# Patient Record
Sex: Male | Born: 1950 | Race: Black or African American | Hispanic: No | Marital: Married | Smoking: Former smoker
Health system: Southern US, Community
[De-identification: ages and names within clinical notes are randomized; demographics above are authoritative.]

## PROBLEM LIST (undated history)

## (undated) DIAGNOSIS — N200 Calculus of kidney: Secondary | ICD-10-CM

## (undated) DIAGNOSIS — Z8719 Personal history of other diseases of the digestive system: Secondary | ICD-10-CM

## (undated) DIAGNOSIS — I509 Heart failure, unspecified: Secondary | ICD-10-CM

## (undated) DIAGNOSIS — I517 Cardiomegaly: Secondary | ICD-10-CM

## (undated) DIAGNOSIS — R7303 Prediabetes: Secondary | ICD-10-CM

## (undated) DIAGNOSIS — I10 Essential (primary) hypertension: Secondary | ICD-10-CM

## (undated) DIAGNOSIS — N201 Calculus of ureter: Secondary | ICD-10-CM

## (undated) DIAGNOSIS — C679 Malignant neoplasm of bladder, unspecified: Secondary | ICD-10-CM

## (undated) DIAGNOSIS — C61 Malignant neoplasm of prostate: Secondary | ICD-10-CM

## (undated) DIAGNOSIS — I4892 Unspecified atrial flutter: Secondary | ICD-10-CM

## (undated) HISTORY — PX: HERNIA REPAIR: SHX51

## (undated) HISTORY — PX: COLONOSCOPY: SHX174

## (undated) HISTORY — PX: TRANSURETHRAL RESECTION OF BLADDER TUMOR WITH GYRUS (TURBT-GYRUS): SHX6458

## (undated) HISTORY — PX: LITHOTRIPSY: SUR834

---

## 2007-07-12 DIAGNOSIS — I10 Essential (primary) hypertension: Secondary | ICD-10-CM | POA: Insufficient documentation

## 2012-03-22 ENCOUNTER — Other Ambulatory Visit (HOSPITAL_COMMUNITY): Payer: Self-pay | Admitting: Urology

## 2012-03-22 ENCOUNTER — Ambulatory Visit (HOSPITAL_COMMUNITY)
Admission: RE | Admit: 2012-03-22 | Discharge: 2012-03-22 | Disposition: A | Discharge status: Home / Self Care | Payer: BC Managed Care – PPO | Source: Ambulatory Visit | Attending: Urology | Admitting: Urology

## 2012-03-22 DIAGNOSIS — R109 Unspecified abdominal pain: Secondary | ICD-10-CM | POA: Insufficient documentation

## 2012-03-22 DIAGNOSIS — N201 Calculus of ureter: Secondary | ICD-10-CM

## 2012-05-05 ENCOUNTER — Other Ambulatory Visit (HOSPITAL_COMMUNITY): Payer: Self-pay | Admitting: Urology

## 2012-05-05 ENCOUNTER — Ambulatory Visit (HOSPITAL_COMMUNITY)
Admission: RE | Admit: 2012-05-05 | Discharge: 2012-05-05 | Disposition: A | Discharge status: Home / Self Care | Payer: BC Managed Care – PPO | Source: Ambulatory Visit | Attending: Urology | Admitting: Urology

## 2012-05-05 DIAGNOSIS — Z87442 Personal history of urinary calculi: Secondary | ICD-10-CM | POA: Insufficient documentation

## 2012-05-05 DIAGNOSIS — N200 Calculus of kidney: Secondary | ICD-10-CM

## 2012-05-05 DIAGNOSIS — N201 Calculus of ureter: Secondary | ICD-10-CM

## 2012-05-05 DIAGNOSIS — R109 Unspecified abdominal pain: Secondary | ICD-10-CM | POA: Insufficient documentation

## 2012-05-05 HISTORY — DX: Calculus of kidney: N20.0

## 2012-05-05 HISTORY — DX: Calculus of ureter: N20.1

## 2018-07-08 HISTORY — PX: PROSTATE BIOPSY: SHX241

## 2018-09-29 ENCOUNTER — Other Ambulatory Visit: Payer: Self-pay

## 2018-09-29 ENCOUNTER — Ambulatory Visit
Admission: RE | Admit: 2018-09-29 | Discharge: 2018-09-29 | Disposition: A | Discharge status: Home / Self Care | Payer: BLUE CROSS/BLUE SHIELD | Source: Ambulatory Visit | Attending: Radiation Oncology | Admitting: Radiation Oncology

## 2018-09-29 ENCOUNTER — Encounter: Payer: Self-pay | Admitting: Radiation Oncology

## 2018-09-29 VITALS — BP 135/88 | HR 90 | Temp 98.0°F | Resp 20 | Ht 72.0 in | Wt 189.0 lb

## 2018-09-29 DIAGNOSIS — Z87891 Personal history of nicotine dependence: Secondary | ICD-10-CM | POA: Diagnosis not present

## 2018-09-29 DIAGNOSIS — Z8551 Personal history of malignant neoplasm of bladder: Secondary | ICD-10-CM | POA: Insufficient documentation

## 2018-09-29 DIAGNOSIS — C61 Malignant neoplasm of prostate: Secondary | ICD-10-CM

## 2018-09-29 DIAGNOSIS — Z8546 Personal history of malignant neoplasm of prostate: Secondary | ICD-10-CM | POA: Insufficient documentation

## 2018-09-29 DIAGNOSIS — Z79899 Other long term (current) drug therapy: Secondary | ICD-10-CM | POA: Diagnosis not present

## 2018-09-29 DIAGNOSIS — Z7901 Long term (current) use of anticoagulants: Secondary | ICD-10-CM | POA: Diagnosis not present

## 2018-09-29 DIAGNOSIS — Z7982 Long term (current) use of aspirin: Secondary | ICD-10-CM | POA: Diagnosis not present

## 2018-09-29 HISTORY — DX: Malignant neoplasm of bladder, unspecified: C67.9

## 2018-09-29 HISTORY — DX: Malignant neoplasm of prostate: C61

## 2018-09-29 NOTE — Progress Notes (Signed)
GU Location of Tumor / Histology: prostatic adenocarcinoma  If Prostate Cancer, Gleason Score is (3 + 4) and PSA is (7). Prostate volume: 27.48 cc.   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 In July, Dr. Lerry Liner referred the patient to Dr. Gloriann Loan for further evaluation of an elevated PSA.   Biopsies of prostate (if applicable) revealed:    Past/Anticipated interventions by urology, if any: prostate biopsy, CT (negative), bone scan (negative), referral to radiation oncology  Past/Anticipated interventions by medical oncology, if any: no  Weight changes, if any: no  Bowel/Bladder complaints, if any: Denies dysuria, urinary leakage or incontinence. Reports he recently had to have a urinary catheter placed due to retention. Patient questions if his hematuria is related to this or kidney stones.    Nausea/Vomiting, if any: no  Pain issues, if any:  no  SAFETY ISSUES:  Prior radiation? no  Pacemaker/ICD? no  Possible current pregnancy? no  Is the patient on methotrexate? no  Current Complaints / other details:  67 year old male. Married with two son. Mother and father both with hx of cancer. Patient interest in brachytherapy.

## 2018-09-29 NOTE — Progress Notes (Signed)
Radiation Oncology         [phone]) [phone] ________________________________  Initial outpatient Consultation  Name: Jason Sanders MRN: [id number]  Date: 09/29/2018  DOB: [birth year]  CC:No primary care provider on file.  Jason Mallow, MD   REFERRING PHYSICIAN: Lucas Mallow, MD  DIAGNOSIS: 67 y.o. gentleman with Stage T1c adenocarcinoma of the prostate with Gleason score of 3+4, and PSA of 7.    ICD-10-CM   1. Malignant neoplasm of prostate (Jason Sanders) C61     HISTORY OF PRESENT ILLNESS: Jason Sanders is a 67 y.o. male with a diagnosis of prostate cancer. He was noted to have an elevated PSA of 7.  Accordingly, he was referred for evaluation in urology by Dr. Lerry Sanders in [place], [place] in August 2019.  The patient proceeded to transrectal ultrasound with 12 biopsies of the prostate on 07/08/18.  The prostate volume measured 27.48 cc.  Out of 12 core biopsies, 3 were positive.  The maximum Gleason score was 3+4, and this was seen in right lateral apex and mid.  CT abdomen/pelvis and bone scan performed on 07/27/18 were both negative for metastatic disease.  He was referred for evaluation in urology in [facility] to Dr. Gloriann Sanders.  The patient has kindly been referred today for discussion of potential radiation treatment options.   PREVIOUS RADIATION THERAPY: No  PAST MEDICAL HISTORY:  Past Medical History:  Diagnosis Date  . Bladder cancer ([place])   . Prostate cancer ([place])       PAST SURGICAL HISTORY: Past Surgical History:  Procedure Laterality Date  . TRANSURETHRAL RESECTION OF BLADDER TUMOR WITH GYRUS (TURBT-GYRUS)      FAMILY HISTORY:  Family History  Problem Relation Age of Onset  . Cancer Mother   . Cancer Father     SOCIAL HISTORY:  Social History   Socioeconomic History  . Marital status: Married    Spouse name: Not on file  . Number of children: Not on file  . Years of education: Not on file  . Highest education level: Not on file  Occupational History  . Not on  file  Social Needs  . Financial resource strain: Not on file  . Food insecurity:    Worry: Not on file    Inability: Not on file  . Transportation needs:    Medical: Not on file    Non-medical: Not on file  Tobacco Use  . Smoking status: Former Smoker    Packs/day: 1.00    Years: 15.00    Pack years: 15.00    Types: Cigarettes    Last attempt to quit: 11/18/2003    Years since quitting: 14.8  . Smokeless tobacco: Never Used  Substance and Sexual Activity  . Alcohol use: Never    Frequency: Never  . Drug use: Never  . Sexual activity: Not Currently  Lifestyle  . Physical activity:    Days per week: Not on file    Minutes per session: Not on file  . Stress: Not on file  Relationships  . Social connections:    Talks on phone: Not on file    Gets together: Not on file    Attends religious service: Not on file    Active member of club or organization: Not on file    Attends meetings of clubs or organizations: Not on file    Relationship status: Not on file  . Intimate partner violence:    Fear of current or ex partner: Not on file  Emotionally abused: Not on file    Physically abused: Not on file    Forced sexual activity: Not on file  Other Topics Concern  . Not on file  Social History Narrative  . Not on file    ALLERGIES: Penicillins  MEDICATIONS:  Current Outpatient Medications  Medication Sig Dispense Refill  . ASPIRIN 81 PO TAKE 1 TABLET BY MOUTH EVERY DAY  11  . carvedilol (COREG) 12.5 MG tablet     . diltiazem (CARDIZEM) 120 MG tablet     . ELIQUIS 5 MG TABS tablet Take 5 mg by mouth 2 (two) times daily.  1  . ENTRESTO 97-103 MG Take 1 tablet by mouth 2 (two) times daily.  12  . furosemide (LASIX) 20 MG tablet Take 20 mg by mouth daily.  1  . rosuvastatin (CRESTOR) 40 MG tablet TAKE 1 TABLET BY MOUTH EVERYDAY AT BEDTIME  1  . tamsulosin (FLOMAX) 0.4 MG CAPS capsule TAKE 2 CAPSULES BY MOUTH DAILY AFTER BREAKFAST  3   No current facility-administered  medications for this encounter.     REVIEW OF SYSTEMS:  On review of systems, the patient reports that he is doing well overall. He denies any chest pain, shortness of breath, cough, fevers, chills, night sweats, unintended weight changes. He denies any bowel disturbances, and denies abdominal pain, nausea or vomiting. He denies any new musculoskeletal or joint aches or pains. He reports he recently had a urinary catheter placed due to retention. He reports hematuria, which he questions if it's related to the catheter or to kidney stones. His IPSS was 5, indicating mild urinary symptoms. His SHIM indicated he does have erectile dysfunction. A complete review of systems is obtained and is otherwise negative.   PHYSICAL EXAM:  Wt Readings from Last 3 Encounters:  09/29/18 189 lb (85.7 kg)   Temp Readings from Last 3 Encounters:  09/29/18 98 F (36.7 C) (Oral)   BP Readings from Last 3 Encounters:  09/29/18 135/88   Pulse Readings from Last 3 Encounters:  09/29/18 90   Pain Assessment Pain Score: 0-No pain/10  In general this is a well appearing African American gentleman in no acute distress. He is alert and oriented x4 and appropriate throughout the examination. HEENT reveals that the patient is normocephalic, atraumatic. EOMs are intact. PERRLA. Skin is intact without any evidence of gross lesions. Cardiovascular exam reveals a regular rate and rhythm, no clicks rubs or murmurs are auscultated. Chest is clear to auscultation bilaterally. Lymphatic assessment is performed and does not reveal any adenopathy in the cervical, supraclavicular, axillary, or inguinal chains. Abdomen has active bowel sounds in all quadrants and is intact. The abdomen is soft, non tender, non distended. Lower extremities are negative for pretibial pitting edema, deep calf tenderness, cyanosis or clubbing.   KPS = 100  100 - Normal; no complaints; no evidence of disease. 90   - Able to carry on normal activity;  minor signs or symptoms of disease. 80   - Normal activity with effort; some signs or symptoms of disease. 17   - Cares for self; unable to carry on normal activity or to do active work. 60   - Requires occasional assistance, but is able to care for most of his personal needs. 50   - Requires considerable assistance and frequent medical care. 33   - Disabled; requires special care and assistance. 21   - Severely disabled; hospital admission is indicated although death not imminent. 20   -  Very sick; hospital admission necessary; active supportive treatment necessary. 10   - Moribund; fatal processes progressing rapidly. 0     - Dead  Karnofsky DA, Abelmann WH, Craver LS and Burchenal JH [phone]) The use of the nitrogen mustards in the palliative treatment of carcinoma: with particular reference to bronchogenic carcinoma Cancer 1 634-56  LABORATORY DATA:  No results found for: WBC, HGB, HCT, MCV, PLT No results found for: NA, K, CL, CO2 No results found for: ALT, AST, GGT, ALKPHOS, BILITOT   RADIOGRAPHY: No results found.    IMPRESSION/PLAN: 1. 67 y.o. gentleman with Stage T1c adenocarcinoma of the prostate with Gleason Score of 3+4, and PSA of 7. We discussed the patient's workup and outlines the nature of prostate cancer in this setting. The patient's T stage, Gleason's score, and PSA put him into the favorable intermediate risk group. Accordingly, he is eligible for a variety of potential treatment options including brachytherapy versus 5.5 weeks of external radiation. We discussed the available radiation techniques, and focused on the details and logistics and delivery. We discussed and outlined the risks, benefits, short and long-term effects associated with radiotherapy and compared and contrasted these with prostatectomy. We discussed the role of SpaceOAR in reducing the rectal toxicity associated with radiotherapy.   At the end of the conversation the patient is interested in moving  forward with brachytherapy and use of SpaceOAR to reduce rectal toxicity from radiotherapy.  We will share our discussion with Dr. Gloriann Sanders and move forward with scheduling his CT [facility] planning appointment in the near future.  The patient met briefly with Jason Sanders in our office who will be working closely with him to coordinate OR scheduling and pre and post procedure appointments.    He will have a prostate MRI following his post-seed CT SIM to confirm appropriate distribution of the Jason Sanders.  We spent time face to face with the patient and more than 50% of that time was spent in counseling and/or coordination of care.    Jason Johns, PA-C    Jason Pita, MD  [facility] Direct Dial: [phone]  Fax: [phone] [email or web address]  Skype  LinkedIn   This document serves as a record of services personally performed by Jason Pita, MD and Jason Caldron, PA-C. It was created on their behalf by Jason Sanders, a trained medical scribe. The creation of this record is based on the scribe's personal observations and the provider's statements to them. This document has been checked and approved by the attending provider.

## 2018-09-29 NOTE — Progress Notes (Signed)
See progress note under physician encounter. 

## 2018-10-01 ENCOUNTER — Other Ambulatory Visit: Payer: Self-pay | Admitting: Urology

## 2018-10-01 ENCOUNTER — Telehealth: Payer: Self-pay | Admitting: *Deleted

## 2018-10-01 NOTE — Telephone Encounter (Signed)
CALLED PATIENT TO INFORM OF PRE-SEED PLANNING CT AND HIS IMPLANT, LVM FOR A RETURN CALL 

## 2018-10-04 ENCOUNTER — Encounter: Payer: Self-pay | Admitting: Medical Oncology

## 2018-10-21 ENCOUNTER — Telehealth: Payer: Self-pay | Admitting: *Deleted

## 2018-10-21 NOTE — Telephone Encounter (Signed)
Called patient to remind of pre-seed appts., no answer unable to leave message

## 2018-10-22 ENCOUNTER — Ambulatory Visit (HOSPITAL_COMMUNITY)
Admission: RE | Admit: 2018-10-22 | Discharge: 2018-10-22 | Disposition: A | Discharge status: Home / Self Care | Payer: BLUE CROSS/BLUE SHIELD | Source: Ambulatory Visit | Attending: Urology | Admitting: Urology

## 2018-10-22 ENCOUNTER — Other Ambulatory Visit: Payer: Self-pay | Admitting: Urology

## 2018-10-22 ENCOUNTER — Ambulatory Visit
Admission: RE | Admit: 2018-10-22 | Discharge: 2018-10-22 | Disposition: A | Discharge status: Home / Self Care | Payer: BLUE CROSS/BLUE SHIELD | Source: Ambulatory Visit | Attending: Radiation Oncology | Admitting: Radiation Oncology

## 2018-10-22 ENCOUNTER — Encounter (HOSPITAL_COMMUNITY)
Admission: RE | Admit: 2018-10-22 | Discharge: 2018-10-22 | Disposition: A | Discharge status: Home / Self Care | Payer: BLUE CROSS/BLUE SHIELD | Source: Ambulatory Visit | Attending: Urology | Admitting: Urology

## 2018-10-22 VITALS — BP 107/80 | HR 76 | Temp 97.8°F | Resp 20 | Ht 72.0 in | Wt 182.2 lb

## 2018-10-22 DIAGNOSIS — C61 Malignant neoplasm of prostate: Secondary | ICD-10-CM | POA: Insufficient documentation

## 2018-10-22 DIAGNOSIS — I517 Cardiomegaly: Secondary | ICD-10-CM

## 2018-10-22 HISTORY — DX: Cardiomegaly: I51.7

## 2018-10-22 NOTE — Progress Notes (Signed)
  Radiation Oncology         [phone]) [phone] ________________________________  Name: Jason Sanders MRN: [id number]  Date: 10/22/2018  DOB: [birth year]  SIMULATION AND TREATMENT PLANNING NOTE PUBIC ARCH STUDY  CC:No primary care provider on file.  Lucas Mallow, MD  DIAGNOSIS: 67 y.o. gentleman with Stage T1c adenocarcinoma of the prostate with Gleason score of 3+4, and PSA of 7.     ICD-10-CM   1. Malignant neoplasm of prostate ([place]) C61     COMPLEX SIMULATION:  The patient presented today for evaluation for possible prostate seed implant. He was brought to the radiation planning suite and placed supine on the CT couch. A 3-dimensional image study set was obtained in upload to the planning computer. There, on each axial slice, I contoured the prostate gland. Then, using three-dimensional radiation planning tools I reconstructed the prostate in view of the structures from the transperineal needle pathway to assess for possible pubic arch interference. In doing so, I did not appreciate any pubic arch interference. Also, the patient's prostate volume was estimated based on the drawn structure. The volume was 28 cc.  Given the pubic arch appearance and prostate volume, patient remains a good candidate to proceed with prostate seed implant. Today, he freely provided informed written consent to proceed.    PLAN: The patient will undergo prostate seed implant.   ________________________________  Sheral Apley. Tammi Klippel, M.D.

## 2018-10-29 NOTE — Progress Notes (Signed)
Spoke face to face with dr Gerald Stabs moser anesthesia and made aware 10-06-18 ekg results [facility] and 10-22-18 ekg results from [facility]. Patient does not need cardiology clearance as long as he has cardiologist and cardiologist is aware of atrial flutter. Dr Ermalene Postin also made aware  is aware patient on blood thinner for atrial flutter.

## 2018-11-08 ENCOUNTER — Other Ambulatory Visit: Payer: Self-pay

## 2018-11-08 ENCOUNTER — Encounter (HOSPITAL_BASED_OUTPATIENT_CLINIC_OR_DEPARTMENT_OTHER): Payer: Self-pay

## 2018-11-08 NOTE — Progress Notes (Signed)
Spoke with:  Jason Sanders NPO:  After Midnight, no gum, candy, or mints   Arrival time: 0930AM Labs: (CBC, CMP, PT, PTT in epic) AM medications:  Fleet enema, carvedilol, Diltiazem, Tamsulosin Pre op orders: Yes Ride home:  Jason Sanders (wife) [phone]

## 2018-11-12 ENCOUNTER — Telehealth: Payer: Self-pay | Admitting: *Deleted

## 2018-11-12 NOTE — Telephone Encounter (Signed)
Called patient to remind of lab work for 11-15-18 - arrival time- 12:45 pm @ [facility], spoke with patient and he is aware of this appt.

## 2018-11-15 ENCOUNTER — Encounter (HOSPITAL_COMMUNITY)
Admission: RE | Admit: 2018-11-15 | Discharge: 2018-11-15 | Disposition: A | Discharge status: Home / Self Care | Payer: BLUE CROSS/BLUE SHIELD | Source: Ambulatory Visit | Attending: Urology | Admitting: Urology

## 2018-11-15 DIAGNOSIS — Z01812 Encounter for preprocedural laboratory examination: Secondary | ICD-10-CM | POA: Diagnosis present

## 2018-11-15 LAB — COMPREHENSIVE METABOLIC PANEL
ALBUMIN: 4 g/dL (ref 3.5–5.0)
ALK PHOS: 53 U/L (ref 38–126)
ALT: 27 U/L (ref 0–44)
AST: 20 U/L (ref 15–41)
Anion gap: 8 (ref 5–15)
BILIRUBIN TOTAL: 1.2 mg/dL (ref 0.3–1.2)
BUN: 19 mg/dL (ref 8–23)
CALCIUM: 8.8 mg/dL — AB (ref 8.9–10.3)
CO2: 24 mmol/L (ref 22–32)
Chloride: 110 mmol/L (ref 98–111)
Creatinine, Ser: 1.03 mg/dL (ref 0.61–1.24)
GFR calc Af Amer: >60 mL/min (ref >60)
GLUCOSE: 190 mg/dL — AB (ref 70–99)
Potassium: 3.8 mmol/L (ref 3.5–5.1)
Sodium: 142 mmol/L (ref 135–145)
TOTAL PROTEIN: 6.4 g/dL — AB (ref 6.5–8.1)

## 2018-11-15 LAB — CBC
HEMATOCRIT: 41.9 % (ref 39.0–52.0)
HEMOGLOBIN: 13.5 g/dL (ref 13.0–17.0)
MCH: 28.5 pg (ref 26.0–34.0)
MCHC: 32.2 g/dL (ref 30.0–36.0)
MCV: 88.6 fL (ref 80.0–100.0)
NRBC: 0 % (ref 0.0–0.2)
Platelets: 183 10*3/uL (ref 150–400)
RBC: 4.73 MIL/uL (ref 4.22–5.81)
RDW: 13.2 % (ref 11.5–15.5)
WBC: 3.5 10*3/uL — AB (ref 4.0–10.5)

## 2018-11-15 LAB — PROTIME-INR
INR: 1.38
PROTHROMBIN TIME: 16.9 s — AB (ref 11.4–15.2)

## 2018-11-15 LAB — APTT: aPTT: 40 seconds — ABNORMAL HIGH (ref 24–36)

## 2018-11-15 NOTE — Progress Notes (Signed)
CBC, CMP, PT, PTT results 11/15/2018 sent to Dr. Gloriann Loan via epic.

## 2018-11-19 ENCOUNTER — Telehealth: Payer: Self-pay | Admitting: *Deleted

## 2018-11-19 NOTE — Telephone Encounter (Signed)
CALLED PATIENT TO REMIND OF IMPLANT ON 1-620, NO ANSWER UNABLE TO LEAVE MESSAGE

## 2018-11-19 NOTE — Anesthesia Preprocedure Evaluation (Addendum)
Anesthesia Evaluation  Patient identified by MRN, date of birth, ID band Patient awake    Reviewed: Allergy & Precautions, NPO status , Patient's Chart, lab work & pertinent test results, reviewed documented beta blocker date and time   History of Anesthesia Complications Negative for: history of anesthetic complications  Airway Mallampati: I  TM Distance: >3 FB Neck ROM: Full    Dental  (+) Dental Advisory Given   Pulmonary former smoker (quit 2005),    breath sounds clear to auscultation       Cardiovascular hypertension, Pt. on medications and Pt. on home beta blockers (-) angina+CHF and + DOE  + dysrhythmias Atrial Fibrillation  Rhythm:Regular Rate:Normal  '18 ECHO: EF 20-25%, severely reduced LVF, mild AI   Neuro/Psych negative neurological ROS     GI/Hepatic negative GI ROS, Neg liver ROS,   Endo/Other  negative endocrine ROS  Renal/GU negative Renal ROS   Bladder cancer Prostate cancer    Musculoskeletal   Abdominal   Peds  Hematology Eliquis: last dose 11/18/2018   Anesthesia Other Findings   Reproductive/Obstetrics                          Anesthesia Physical Anesthesia Plan  ASA: III  Anesthesia Plan: General   Post-op Pain Management:    Induction: Intravenous  PONV Risk Score and Plan: 2 and Ondansetron and Dexamethasone  Airway Management Planned: Oral ETT  Additional Equipment:   Intra-op Plan:   Post-operative Plan: Extubation in OR  Informed Consent: I have reviewed the patients History and Physical, chart, labs and discussed the procedure including the risks, benefits and alternatives for the proposed anesthesia with the patient or authorized representative who has indicated his/her understanding and acceptance.   Dental advisory given  Plan Discussed with: CRNA and Surgeon  Anesthesia Plan Comments: (See PST note 11/15/18, Konrad Felix, PA-C Plan  routine monitors, GETA)       Anesthesia Quick Evaluation

## 2018-11-19 NOTE — Progress Notes (Signed)
Last office visit note from Dr. Claudie Fisherman 08/24/2018 and cardiac clearance, EKG 10/06/2018 and 10/22/2018 placed in chart.

## 2018-11-19 NOTE — Telephone Encounter (Signed)
CALLED PATIENT TO REMIND OF PROCEDURE FOR 11-22-18, SPOKE WITH PATIENT AND HE IS AWARE OF THIS PROCEDURE.

## 2018-11-19 NOTE — Progress Notes (Signed)
Anesthesia Chart Review   Case:  [id number] Date/Time:  11/22/18 1130   Procedure:  RADIOACTIVE SEED IMPLANT/BRACHYTHERAPY IMPLANT (N/A )   Anesthesia type:  General   Pre-op diagnosis:  PROSTATE CANCER   Location:  [facility] OR ROOM 3 / [place]   Surgeon:  Lucas Mallow, MD      DISCUSSION:68 yo former smoker (15 pack years, quit 11/18/03) with h/o bladder cancer, CHF, HTN, Atrial flutter scheduled for above procedure on 11/22/2018.    Atrial flutter and CHF followed by Dr. Claudie Fisherman with [facility].  He was last seen 08/24/18.  Paroxysmal atrial flutter s/p ablation in 10/17.  Atrial flutter seen on EKG during PST visit.  Per Dr. Theodosia Blender note he has frequent recurrences.  He is currently on diltiazem and carvedilol for rate control and anticoagulation with Eliquis.  He is awaiting an ablation and ICD implant at [facility] after cancer treatments.  Dr. Radford Pax also notes that CHF is stable, last echo 08/2017 with EF 20-25%, stable since 2017.  Per Dr. Radford Pax pt cleared and can hold Eliquis 4 days prior to surgery.    Pt can proceed with planned procedure barring acute status change.   PROVIDERS: Abigail Miyamoto, FNP is PCP  Claudie Fisherman, MD is Cardiologist with [facility]: Labs reviewed: Acceptable for surgery. (all labs ordered are listed, but only abnormal results are displayed)  Labs Reviewed  APTT - Abnormal; Notable for the following components:      Result Value   aPTT 40 (*)    All other components within normal limits  CBC - Abnormal; Notable for the following components:   WBC 3.5 (*)    All other components within normal limits  COMPREHENSIVE METABOLIC PANEL - Abnormal; Notable for the following components:   Glucose, Bld 190 (*)    Calcium 8.8 (*)    Total Protein 6.4 (*)    All other components within normal limits  PROTIME-INR - Abnormal; Notable for the following components:   Prothrombin Time 16.9 (*)    All other  components within normal limits     IMAGES: Chest xray 10/22/18 FINDINGS: Mild-to-moderate cardiomegaly. Both lungs are clear. No evidence of pleural effusion. No sclerotic bone lesions identified.  IMPRESSION: Cardiomegaly.  No active lung disease.  EKG: 10/22/18 Atrial flutter with variable A-V block Left ventricular hypertrophy with QRS widening (similar to previous EKG) T wave abnormality, consider lateral ischemia (similar to previous EKG) Abnormal ECG   CV: Per Dr. Theodosia Blender note on 08/24/18 Echo 08/17/17 severely reduced EF 20-25%, mild LVH, marked LAE, mild AI Echo 08/18/16 normal LV size with severely reduced LV systolic function, EF 73-71%, anteroseptal HK, mild AI, marked LAE  Past Medical History:  Diagnosis Date  . Atrial flutter ([place])   . Bladder cancer ([place])   . Cardiomegaly 10/22/2018   Noted on CXR  . CHF (congestive heart failure) ([place])   . History of umbilical hernia   . Hypertension   . Nephrolithiasis 05/05/2012   Left  . Pre-diabetes   . Prostate cancer ([place])   . Ureteral calculus 05/05/2012   Left    Past Surgical History:  Procedure Laterality Date  . COLONOSCOPY    . HERNIA REPAIR     Umbilical  . LITHOTRIPSY    . PROSTATE BIOPSY  07/08/2018  . TRANSURETHRAL RESECTION OF BLADDER TUMOR WITH GYRUS (TURBT-GYRUS)      MEDICATIONS: . ASPIRIN 81 PO  . carvedilol (  COREG) 25 MG tablet  . diltiazem (CARDIZEM) 120 MG tablet  . ELIQUIS 5 MG TABS tablet  . ENTRESTO 97-103 MG  . furosemide (LASIX) 20 MG tablet  . rosuvastatin (CRESTOR) 40 MG tablet  . tamsulosin (FLOMAX) 0.4 MG CAPS capsule   No current facility-administered medications for this encounter.     Konrad Felix, PA-C [facility] Testing [phone] 11/19/18 4:22 PM

## 2018-11-22 ENCOUNTER — Encounter (HOSPITAL_BASED_OUTPATIENT_CLINIC_OR_DEPARTMENT_OTHER)
Admission: RE | Disposition: A | Discharge status: Home / Self Care | Payer: Self-pay | Source: Ambulatory Visit | Attending: Urology

## 2018-11-22 ENCOUNTER — Ambulatory Visit (HOSPITAL_COMMUNITY): Payer: BLUE CROSS/BLUE SHIELD

## 2018-11-22 ENCOUNTER — Ambulatory Visit (HOSPITAL_BASED_OUTPATIENT_CLINIC_OR_DEPARTMENT_OTHER)
Admission: RE | Admit: 2018-11-22 | Discharge: 2018-11-22 | Disposition: A | Discharge status: Home / Self Care | Payer: BLUE CROSS/BLUE SHIELD | Source: Ambulatory Visit | Attending: Urology | Admitting: Urology

## 2018-11-22 ENCOUNTER — Other Ambulatory Visit: Payer: Self-pay

## 2018-11-22 ENCOUNTER — Encounter (HOSPITAL_BASED_OUTPATIENT_CLINIC_OR_DEPARTMENT_OTHER): Payer: Self-pay | Admitting: *Deleted

## 2018-11-22 ENCOUNTER — Ambulatory Visit (HOSPITAL_BASED_OUTPATIENT_CLINIC_OR_DEPARTMENT_OTHER): Payer: BLUE CROSS/BLUE SHIELD | Admitting: Physician Assistant

## 2018-11-22 ENCOUNTER — Ambulatory Visit (HOSPITAL_BASED_OUTPATIENT_CLINIC_OR_DEPARTMENT_OTHER): Payer: BLUE CROSS/BLUE SHIELD | Admitting: Anesthesiology

## 2018-11-22 DIAGNOSIS — Z7901 Long term (current) use of anticoagulants: Secondary | ICD-10-CM | POA: Diagnosis not present

## 2018-11-22 DIAGNOSIS — I110 Hypertensive heart disease with heart failure: Secondary | ICD-10-CM | POA: Diagnosis not present

## 2018-11-22 DIAGNOSIS — I509 Heart failure, unspecified: Secondary | ICD-10-CM | POA: Diagnosis not present

## 2018-11-22 DIAGNOSIS — Z7982 Long term (current) use of aspirin: Secondary | ICD-10-CM | POA: Diagnosis not present

## 2018-11-22 DIAGNOSIS — I4892 Unspecified atrial flutter: Secondary | ICD-10-CM | POA: Diagnosis not present

## 2018-11-22 DIAGNOSIS — Z79899 Other long term (current) drug therapy: Secondary | ICD-10-CM | POA: Insufficient documentation

## 2018-11-22 DIAGNOSIS — Z8551 Personal history of malignant neoplasm of bladder: Secondary | ICD-10-CM | POA: Diagnosis not present

## 2018-11-22 DIAGNOSIS — Z8546 Personal history of malignant neoplasm of prostate: Secondary | ICD-10-CM | POA: Diagnosis not present

## 2018-11-22 DIAGNOSIS — Z87891 Personal history of nicotine dependence: Secondary | ICD-10-CM | POA: Insufficient documentation

## 2018-11-22 DIAGNOSIS — C61 Malignant neoplasm of prostate: Secondary | ICD-10-CM | POA: Insufficient documentation

## 2018-11-22 HISTORY — DX: Unspecified atrial flutter: I48.92

## 2018-11-22 HISTORY — DX: Calculus of kidney: N20.0

## 2018-11-22 HISTORY — DX: Cardiomegaly: I51.7

## 2018-11-22 HISTORY — PX: CYSTOSCOPY: SHX5120

## 2018-11-22 HISTORY — DX: Personal history of other diseases of the digestive system: Z87.19

## 2018-11-22 HISTORY — DX: Essential (primary) hypertension: I10

## 2018-11-22 HISTORY — PX: RADIOACTIVE SEED IMPLANT: SHX5150

## 2018-11-22 HISTORY — DX: Calculus of ureter: N20.1

## 2018-11-22 HISTORY — DX: Heart failure, unspecified: I50.9

## 2018-11-22 HISTORY — DX: Prediabetes: R73.03

## 2018-11-22 LAB — GLUCOSE, CAPILLARY: Glucose-Capillary: 137 mg/dL — ABNORMAL HIGH (ref 70–99)

## 2018-11-22 SURGERY — INSERTION, RADIATION SOURCE, PROSTATE
Anesthesia: General | Site: Prostate

## 2018-11-22 MED ORDER — ROCURONIUM BROMIDE 10 MG/ML (PF) SYRINGE
PREFILLED_SYRINGE | INTRAVENOUS | Status: DC | PRN
  Administered 2018-11-22: 10 mg via INTRAVENOUS
  Administered 2018-11-22: 50 mg via INTRAVENOUS

## 2018-11-22 MED ORDER — FUROSEMIDE 10 MG/ML IJ SOLN
INTRAMUSCULAR | Status: DC | PRN
  Administered 2018-11-22: 20 mg via INTRAMUSCULAR

## 2018-11-22 MED ORDER — MIDAZOLAM HCL 2 MG/2ML IJ SOLN
INTRAMUSCULAR | Status: AC
  Filled 2018-11-22: qty 2

## 2018-11-22 MED ORDER — SODIUM CHLORIDE 0.9 % IV SOLN
INTRAVENOUS | Status: DC | PRN
  Administered 2018-11-22: 25 ug/min via INTRAVENOUS

## 2018-11-22 MED ORDER — LACTATED RINGERS IV SOLN
INTRAVENOUS | Status: DC
  Administered 2018-11-22: 10:00:00 via INTRAVENOUS
  Filled 2018-11-22: qty 1000

## 2018-11-22 MED ORDER — LIDOCAINE 2% (20 MG/ML) 5 ML SYRINGE
INTRAMUSCULAR | Status: DC | PRN
  Administered 2018-11-22: 50 mg via INTRAVENOUS
  Administered 2018-11-22: 40 mg via INTRAVENOUS

## 2018-11-22 MED ORDER — ARTIFICIAL TEARS OPHTHALMIC OINT
TOPICAL_OINTMENT | OPHTHALMIC | Status: AC
  Filled 2018-11-22: qty 3.5

## 2018-11-22 MED ORDER — ONDANSETRON HCL 4 MG/2ML IJ SOLN
INTRAMUSCULAR | Status: AC
  Filled 2018-11-22: qty 2

## 2018-11-22 MED ORDER — CIPROFLOXACIN IN D5W 400 MG/200ML IV SOLN
INTRAVENOUS | Status: AC
  Filled 2018-11-22: qty 200

## 2018-11-22 MED ORDER — DEXAMETHASONE SODIUM PHOSPHATE 10 MG/ML IJ SOLN
INTRAMUSCULAR | Status: AC
  Filled 2018-11-22: qty 1

## 2018-11-22 MED ORDER — HYDROCODONE-ACETAMINOPHEN 5-325 MG PO TABS: 1.0000 | ORAL_TABLET | ORAL | 0 refills | Status: AC | PRN

## 2018-11-22 MED ORDER — SODIUM CHLORIDE 0.9 % IR SOLN
Status: DC | PRN
  Administered 2018-11-22: 1000 mL via INTRAVESICAL

## 2018-11-22 MED ORDER — PROPOFOL 10 MG/ML IV BOLUS
INTRAVENOUS | Status: AC
  Filled 2018-11-22: qty 20

## 2018-11-22 MED ORDER — ETOMIDATE 2 MG/ML IV SOLN
INTRAVENOUS | Status: AC
  Filled 2018-11-22: qty 10

## 2018-11-22 MED ORDER — FENTANYL CITRATE (PF) 100 MCG/2ML IJ SOLN
INTRAMUSCULAR | Status: AC
  Filled 2018-11-22: qty 2

## 2018-11-22 MED ORDER — MIDAZOLAM HCL 2 MG/2ML IJ SOLN
INTRAMUSCULAR | Status: DC | PRN
  Administered 2018-11-22: 2 mg via INTRAVENOUS

## 2018-11-22 MED ORDER — ETOMIDATE 2 MG/ML IV SOLN
INTRAVENOUS | Status: DC | PRN
  Administered 2018-11-22: 20 mg via INTRAVENOUS

## 2018-11-22 MED ORDER — LIDOCAINE 2% (20 MG/ML) 5 ML SYRINGE
INTRAMUSCULAR | Status: AC
  Filled 2018-11-22: qty 5

## 2018-11-22 MED ORDER — IOHEXOL 300 MG/ML  SOLN
INTRAMUSCULAR | Status: DC | PRN
  Administered 2018-11-22: 7 mL via INTRAVENOUS

## 2018-11-22 MED ORDER — SUGAMMADEX SODIUM 200 MG/2ML IV SOLN
INTRAVENOUS | Status: AC
  Filled 2018-11-22: qty 2

## 2018-11-22 MED ORDER — FLEET ENEMA 7-19 GM/118ML RE ENEM
1.0000 | ENEMA | Freq: Once | RECTAL | Status: AC
  Administered 2018-11-22: 1 via RECTAL
  Filled 2018-11-22: qty 1

## 2018-11-22 MED ORDER — PHENYLEPHRINE HCL 10 MG/ML IJ SOLN
INTRAMUSCULAR | Status: AC
  Filled 2018-11-22: qty 1

## 2018-11-22 MED ORDER — FUROSEMIDE 10 MG/ML IJ SOLN
INTRAMUSCULAR | Status: AC
  Filled 2018-11-22: qty 2

## 2018-11-22 MED ORDER — ONDANSETRON HCL 4 MG/2ML IJ SOLN
INTRAMUSCULAR | Status: DC | PRN
  Administered 2018-11-22: 4 mg via INTRAVENOUS

## 2018-11-22 MED ORDER — CIPROFLOXACIN IN D5W 400 MG/200ML IV SOLN
400.0000 mg | INTRAVENOUS | Status: AC
  Administered 2018-11-22: 400 mg via INTRAVENOUS
  Filled 2018-11-22: qty 200

## 2018-11-22 MED ORDER — DEXAMETHASONE SODIUM PHOSPHATE 10 MG/ML IJ SOLN
INTRAMUSCULAR | Status: DC | PRN
  Administered 2018-11-22: 5 mg via INTRAVENOUS

## 2018-11-22 MED ORDER — KETOROLAC TROMETHAMINE 30 MG/ML IJ SOLN
INTRAMUSCULAR | Status: DC | PRN
  Administered 2018-11-22: 30 mg via INTRAVENOUS

## 2018-11-22 SURGICAL SUPPLY — 40 items
BAG URINE DRAINAGE (UROLOGICAL SUPPLIES) ×4 IMPLANT
BLADE CLIPPER SURG (BLADE) ×4 IMPLANT
CATH FOLEY 2WAY SLVR  5CC 16FR (CATHETERS) ×2
CATH FOLEY 2WAY SLVR 5CC 16FR (CATHETERS) ×2 IMPLANT
CATH ROBINSON RED A/P 16FR (CATHETERS) IMPLANT
CATH ROBINSON RED A/P 20FR (CATHETERS) ×4 IMPLANT
CLOTH BEACON ORANGE TIMEOUT ST (SAFETY) ×4 IMPLANT
CONT SPECI 4OZ STER CLIK (MISCELLANEOUS) ×8 IMPLANT
COVER BACK TABLE 60X90IN (DRAPES) ×4 IMPLANT
COVER MAYO STAND STRL (DRAPES) ×4 IMPLANT
COVER WAND RF STERILE (DRAPES) ×4 IMPLANT
DRSG TEGADERM 4X4.75 (GAUZE/BANDAGES/DRESSINGS) ×4 IMPLANT
DRSG TEGADERM 8X12 (GAUZE/BANDAGES/DRESSINGS) ×6 IMPLANT
GAUZE SPONGE 4X4 12PLY STRL (GAUZE/BANDAGES/DRESSINGS) ×2 IMPLANT
GLOVE BIO SURGEON STRL SZ 6 (GLOVE) IMPLANT
GLOVE BIO SURGEON STRL SZ 6.5 (GLOVE) IMPLANT
GLOVE BIO SURGEON STRL SZ7 (GLOVE) IMPLANT
GLOVE BIO SURGEON STRL SZ7.5 (GLOVE) ×4 IMPLANT
GLOVE BIO SURGEON STRL SZ8 (GLOVE) IMPLANT
GLOVE BIO SURGEONS STRL SZ 6.5 (GLOVE)
GLOVE BIOGEL PI IND STRL 6 (GLOVE) IMPLANT
GLOVE BIOGEL PI IND STRL 6.5 (GLOVE) IMPLANT
GLOVE BIOGEL PI IND STRL 7.0 (GLOVE) IMPLANT
GLOVE BIOGEL PI IND STRL 8 (GLOVE) IMPLANT
GLOVE BIOGEL PI INDICATOR 6 (GLOVE)
GLOVE BIOGEL PI INDICATOR 6.5 (GLOVE)
GLOVE BIOGEL PI INDICATOR 7.0 (GLOVE)
GLOVE BIOGEL PI INDICATOR 8 (GLOVE)
GLOVE ECLIPSE 8.0 STRL XLNG CF (GLOVE) ×8 IMPLANT
GOWN STRL REUS W/TWL LRG LVL3 (GOWN DISPOSABLE) ×4 IMPLANT
HOLDER FOLEY CATH W/STRAP (MISCELLANEOUS) IMPLANT
I-SEED AgX100 ×140 IMPLANT
IV SOD CHL 0.9% 1000ML (IV SOLUTION) ×2 IMPLANT
KIT TURNOVER CYSTO (KITS) ×4 IMPLANT
MARKER SKIN DUAL TIP RULER LAB (MISCELLANEOUS) ×4 IMPLANT
PACK CYSTO (CUSTOM PROCEDURE TRAY) ×4 IMPLANT
SUT BONE WAX W31G (SUTURE) IMPLANT
SYR 10ML LL (SYRINGE) ×4 IMPLANT
UNDERPAD 30X30 (UNDERPADS AND DIAPERS) ×8 IMPLANT
WATER STERILE IRR 500ML POUR (IV SOLUTION) ×4 IMPLANT

## 2018-11-22 NOTE — Anesthesia Postprocedure Evaluation (Signed)
Anesthesia Post Note  Patient: Jason Sanders  Procedure(s) Performed: RADIOACTIVE SEED IMPLANT/BRACHYTHERAPY IMPLANT (N/A Prostate) CYSTOSCOPY (N/A Bladder)     Patient location during evaluation: PACU Anesthesia Type: General Level of consciousness: awake and alert Pain management: pain level controlled Vital Signs Assessment: post-procedure vital signs reviewed and stable Respiratory status: spontaneous breathing, nonlabored ventilation, respiratory function stable and patient connected to nasal cannula oxygen Cardiovascular status: blood pressure returned to baseline and stable Postop Assessment: no apparent nausea or vomiting Anesthetic complications: no    Last Vitals:  Vitals:   11/22/18 1445 11/22/18 1515  BP: 120/76 (!) 121/91  Pulse: 71 (!) 41  Resp: (!) 26 (!) 24  Temp:    SpO2: 92% 94%    Last Pain:  Vitals:   11/22/18 1515  TempSrc:   PainSc: 0-No pain                 Jason Sanders

## 2018-11-22 NOTE — Anesthesia Procedure Notes (Signed)
Procedure Name: Intubation Date/Time: 11/22/2018 12:48 PM Performed by: Wanita Chamberlain, CRNA Pre-anesthesia Checklist: Patient identified, Emergency Drugs available, Suction available and Patient being monitored Patient Re-evaluated:Patient Re-evaluated prior to induction Oxygen Delivery Method: Circle system utilized Preoxygenation: Pre-oxygenation with 100% oxygen Induction Type: IV induction Ventilation: Mask ventilation without difficulty Laryngoscope Size: Mac and 4 Grade View: Grade II Tube type: Oral Tube size: 7.5 mm Number of attempts: 1 Airway Equipment and Method: Stylet Placement Confirmation: CO2 detector,  positive ETCO2 and ETT inserted through vocal cords under direct vision Secured at: 22 cm Tube secured with: Tape Dental Injury: Teeth and Oropharynx as per pre-operative assessment

## 2018-11-22 NOTE — Op Note (Signed)
PATIENT:  Jason Sanders  PRE-OPERATIVE DIAGNOSIS:  Adenocarcinoma of the prostate  POST-OPERATIVE DIAGNOSIS:  Same  PROCEDURE:  1. I-125 radioactive seed implantation 2. Cystoscopy   SURGEON:  Surgeon(s): Wendie Simmer, MD  Radiation oncologist: Dr. Tyler Pita  ANESTHESIA:  General  EBL:  Minimal  DRAINS: 73 French Foley catheter  INDICATION: Jason Sanders  Description of procedure: After informed consent the patient was brought to the major OR, placed on the table and administered general anesthesia. He was then moved to the modified lithotomy position with his perineum perpendicular to the floor. His perineum and genitalia were then sterilely prepped. An official timeout was then performed. A 16 French Foley catheter was then placed in the bladder and filled with dilute contrast, a rectal tube was placed in the rectum and the transrectal ultrasound probe was placed in the rectum and affixed to the stand. He was then sterilely draped.  Real time ultrasonography was used along with the seed planning software [employer] Prostate. This was used to develop the seed plan including the number of needles as well as number of seeds required for complete and adequate coverage. Real-time ultrasonography was then used along with the previously developed plan and the Nucletron device to implant a total of 70 seeds using 29 needles. This proceeded without difficulty or complication.   A Foley catheter was then removed as well as the transrectal ultrasound probe and rectal probe. Flexible cystoscopy was then performed using the 17 French flexible scope which revealed a normal urethra throughout its length down to the sphincter which appeared intact. The prostatic urethra revealed bilobar hypertrophy but no evidence of obstruction, seeds, spacers or lesions. The bladder was then entered and fully and systematically inspected. The ureteral orifices were noted to be of normal configuration and  position. The mucosa revealed no evidence of tumors. There were also no stones identified within the bladder. I noted no seeds or spacers on the floor of the bladder and retroflexion of the scope revealed no seeds protruding from the base of the prostate.  The cystoscope was then removed and the patient was awakened and taken to recovery room in stable and satisfactory condition. He tolerated procedure well and there were no intraoperative complications.

## 2018-11-22 NOTE — Transfer of Care (Signed)
Immediate Anesthesia Transfer of Care Note  Patient: Jason Sanders  Procedure(s) Performed: RADIOACTIVE SEED IMPLANT/BRACHYTHERAPY IMPLANT (N/A Prostate) CYSTOSCOPY (N/A Bladder)  Patient Location: PACU  Anesthesia Type:General  Level of Consciousness: awake, alert , oriented and patient cooperative  Airway & Oxygen Therapy: Patient Spontanous Breathing and Patient connected to nasal cannula oxygen  Post-op Assessment: Report given to RN and Post -op Vital signs reviewed and stable  Post vital signs: Reviewed and stable  Last Vitals:  Vitals Value Taken Time  BP    Temp    Pulse 63 11/22/2018  2:02 PM  Resp 21 11/22/2018  2:02 PM  SpO2 96 % 11/22/2018  2:02 PM  Vitals shown include unvalidated device data.  Last Pain:  Vitals:   11/22/18 0948  TempSrc:   PainSc: 0-No pain      Patients Stated Pain Goal: 5 (85/88/50 2774)  Complications: No apparent anesthesia complications

## 2018-11-22 NOTE — Discharge Instructions (Addendum)
Resume blood thinner in 3 days  Antibiotics You may be given a prescription for an antibiotic to take when you arrive home. If so, be sure to take every tablet in the bottle, even if you are feeling better before the prescription is finished. If you begin itching, notice a rash or start to swell on your trunk, arms, legs and/or throat, immediately stop taking the antibiotic and call your Urologist. Diet Resume your usual diet when you return home. To keep your bowels moving easily and softly, drink prune, apple and cranberry juice at room temperature. You may also take a stool softener, such as Colace, which is available without prescription at local pharmacies. Daily activities ? No driving or heavy lifting for at least two days after the implant. ? No bike riding, horseback riding or riding lawn mowers for the first month after the implant. ? Any strenuous physical activity should be approved by your doctor before you resume it. Sexual relations You may resume sexual relations two weeks after the procedure. A condom should be used for the first two weeks. Your semen may be dark brown or black; this is normal and is related bleeding that may have occurred during the implant. Postoperative swelling Expect swelling and bruising of the scrotum and perineum (the area between the scrotum and anus). Both the swelling and the bruising should resolve in l or 2 weeks. Ice packs and over- the-counter medications such as Tylenol, Advil or Aleve may lessen your discomfort. Postoperative urination Most men experience burning on urination and/or urinary frequency. If this becomes bothersome, contact your Urologist.  Medication can be prescribed to relieve these problems.  It is normal to have some blood in your urine for a few days after the implant. Special instructions related to the seeds It is unlikely that you will pass an Iodine-125 seed in your urine. The seeds are silver in color and are about as large  as a grain of rice. If you pass a seed, do not handle it with your fingers. Use a spoon to place it in an envelope or jar in place this in base occluded area such as the garage or basement for return to the radiation clinic at your convenience.  Contact your doctor for ? Temperature greater than 101 F ? Increasing pain ? Inability to urinate Follow-up  You should have follow up with your urologist and radiation oncologist about 3 weeks after the procedure. General information regarding Iodine seeds ? Iodine-125 is a low energy radioactive material. It is not deeply penetrating and loses energy at short distances. Your prostate will absorb the radiation. Objects that are touched or used by the patient do not become radioactive. ? Body wastes (urine and stool) or body fluids (saliva, tears, semen or blood) are not radioactive. ? The Nuclear Regulatory Commission [facility]) has determined that no radiation precautions are needed for patients undergoing Iodine-125 seed implantation. The [facility] states that such patients do not present a risk to the people around them, including young children and pregnant women. However, in keeping with the general principle that radiation exposure should be kept as low reasonably possible, we suggest the following: ? Children and pets should not sit on the patient's lap for the first two (2) weeks after the implant. ? Pregnant (or possibly pregnant) women should avoid prolonged, close contact with the patient for the first two (2) weeks after the implant. ? A distance of three (3) feet is acceptable. ? At a distance of three (3)  feet, there is no limit to the length of time anyone can be with the patient.   Post Anesthesia Home Care Instructions  Activity: Get plenty of rest for the remainder of the day. A responsible individual must stay with you for 24 hours following the procedure.  For the next 24 hours, DO NOT: -Drive a car -Paediatric nurse -Drink alcoholic  beverages -Take any medication unless instructed by your physician -Make any legal decisions or sign important papers.  Meals: Start with liquid foods such as gelatin or soup. Progress to regular foods as tolerated. Avoid greasy, spicy, heavy foods. If nausea and/or vomiting occur, drink only clear liquids until the nausea and/or vomiting subsides. Call your physician if vomiting continues.  Special Instructions/Symptoms: Your throat may feel dry or sore from the anesthesia or the breathing tube placed in your throat during surgery. If this causes discomfort, gargle with warm salt water. The discomfort should disappear within 24 hours.

## 2018-11-22 NOTE — H&P (Signed)
H&P  Chief Complaint: Prostate cancer  History of Present Illness: 68 year old male with prostate cancer elected to undergo brachytherapy.  He presents today to undergo this as well as space or  Past Medical History:  Diagnosis Date  . Atrial flutter ([place])   . Bladder cancer ([place])   . Cardiomegaly 10/22/2018   Noted on CXR  . CHF (congestive heart failure) ([place])   . History of umbilical hernia   . Hypertension   . Nephrolithiasis 05/05/2012   Left  . Pre-diabetes   . Prostate cancer ([place])   . Ureteral calculus 05/05/2012   Left   Past Surgical History:  Procedure Laterality Date  . COLONOSCOPY    . HERNIA REPAIR     Umbilical  . LITHOTRIPSY    . PROSTATE BIOPSY  07/08/2018  . TRANSURETHRAL RESECTION OF BLADDER TUMOR WITH GYRUS (TURBT-GYRUS)      Home Medications:  Medications Prior to Admission  Medication Sig Dispense Refill Last Dose  . ASPIRIN 81 PO TAKE 1 TABLET BY MOUTH EVERY DAY  11 Past Week at Unknown time  . carvedilol (COREG) 25 MG tablet 2 (two) times daily.    11/22/2018 at 0700  . diltiazem (CARDIZEM) 120 MG tablet 2 (two) times daily.    11/22/2018 at 0700  . ELIQUIS 5 MG TABS tablet Take 5 mg by mouth 2 (two) times daily.  1 Past Week at Unknown time  . ENTRESTO 97-103 MG Take 1 tablet by mouth 2 (two) times daily.  12 11/22/2018 at 0700  . furosemide (LASIX) 20 MG tablet Take 20 mg by mouth daily.  1 11/21/2018 at Unknown time  . rosuvastatin (CRESTOR) 40 MG tablet every evening.   1 11/21/2018 at Unknown time  . tamsulosin (FLOMAX) 0.4 MG CAPS capsule TAKE 2 CAPSULES BY MOUTH DAILY AFTER BREAKFAST  3 11/21/2018 at Unknown time   Allergies:  Allergies  Allergen Reactions  . Penicillins     Childhood allergy    Family History  Problem Relation Age of Onset  . Cancer Mother   . Cancer Father    Social History:  reports that he quit smoking about 15 years ago. His smoking use included cigarettes. He has a 15.00 pack-year smoking history. He has never used  smokeless tobacco. He reports that he does not drink alcohol or use drugs.  ROS: A complete review of systems was performed.  All systems are negative except for pertinent findings as noted. ROS   Physical Exam:  Vital signs in last 24 hours: Temp:  [97.3 F (36.3 C)] 97.3 F (36.3 C) (01/06 0926) Pulse Rate:  [94] 94 (01/06 0926) Resp:  [16] 16 (01/06 0926) BP: (116)/(83) 116/83 (01/06 0926) SpO2:  [98 %] 98 % (01/06 0926) Weight:  [83.5 kg] 83.5 kg (01/06 0926) General:  Alert and oriented, No acute distress HEENT: Normocephalic, atraumatic Neck: No JVD or lymphadenopathy Cardiovascular: Regular rate and rhythm Lungs: Regular rate and effort Abdomen: Soft, nontender, nondistended, no abdominal masses Back: No CVA tenderness Extremities: No edema Neurologic: Grossly intact  Laboratory Data:  No results found for this or any previous visit (from the past 24 hour(s)). No results found for this or any previous visit (from the past 240 hour(s)). Creatinine: Recent Labs    11/15/18 1257  CREATININE 1.03    Impression/Assessment:  Prostate cancer  Plan:  Proceed with brachytherapy with spaceoar  Marton Redwood, III 11/22/2018, 12:29 PM

## 2018-11-22 NOTE — Progress Notes (Signed)
  Radiation Oncology         [phone] ________________________________  Name: Tomaz Janis MRN: [id number]  Date: 11/23/2018  DOB: [birth year]       Prostate Seed Implant  VX:YIAXK, Benjamine Mola, FNP  No ref. provider found  DIAGNOSIS: 68 y.o. gentleman with Stage T1c adenocarcinoma of the prostate with Gleason score of 3+4, and PSA of 7    ICD-10-CM   1. Prostate cancer ([place]) C61 DG Chest 2 View    DG Chest 2 View    PROCEDURE: Insertion of radioactive I-125 seeds into the prostate gland.  RADIATION DOSE: 145 Gy, definitive therapy.  TECHNIQUE: Jorge Retz was brought to the operating room with the urologist. He was placed in the dorsolithotomy position. He was catheterized and a rectal tube was inserted. The perineum was shaved, prepped and draped. The ultrasound probe was then introduced into the rectum to see the prostate gland.  TREATMENT DEVICE: A needle grid was attached to the ultrasound probe stand and anchor needles were placed.  3D PLANNING: The prostate was imaged in 3D using a sagittal sweep of the prostate probe. These images were transferred to the planning computer. There, the prostate, urethra and rectum were defined on each axial reconstructed image. Then, the software created an optimized 3D plan and a few seed positions were adjusted. The quality of the plan was reviewed using [facility] information for the target and the following two organs at risk:  Urethra and Rectum.  Then the accepted plan was printed and handed off to the radiation therapist.  Under my supervision, the custom loading of the seeds and spacers was carried out and loaded into sealed vicryl sleeves.  These pre-loaded needles were then placed into the needle holder.Marland Kitchen  PROSTATE VOLUME STUDY:  Using transrectal ultrasound the volume of the prostate was verified to be 33.4 cc.  SPECIAL TREATMENT PROCEDURE/SUPERVISION AND HANDLING: The pre-loaded needles were then delivered under sagittal guidance. A total of  29 needles were used to deposit 70 seeds in the prostate gland. The individual seed activity was 0.397 mCi.  SpaceOAR:  No  COMPLEX SIMULATION: At the end of the procedure, an anterior radiograph of the pelvis was obtained to document seed positioning and count. Cystoscopy was performed to check the urethra and bladder.  MICRODOSIMETRY: At the end of the procedure, the patient was emitting 0.072 mR/hr at 1 meter. Accordingly, he was considered safe for hospital discharge.  PLAN: The patient will return to the radiation oncology clinic for post implant CT dosimetry in three weeks.   ________________________________  Sheral Apley Tammi Klippel, M.D.

## 2018-11-23 ENCOUNTER — Encounter (HOSPITAL_BASED_OUTPATIENT_CLINIC_OR_DEPARTMENT_OTHER): Payer: Self-pay | Admitting: Urology

## 2018-12-08 ENCOUNTER — Telehealth: Payer: Self-pay | Admitting: *Deleted

## 2018-12-08 NOTE — Telephone Encounter (Signed)
CALLED PATIENT TO REMIND OF POST SEED APPTS. FOR 12-09-18, SPOKE WITH PATIENT AND HE IS AWARE OF THESE APPTS.

## 2018-12-09 ENCOUNTER — Encounter: Payer: Self-pay | Admitting: Urology

## 2018-12-09 ENCOUNTER — Ambulatory Visit
Admission: RE | Admit: 2018-12-09 | Discharge: 2018-12-09 | Disposition: A | Discharge status: Home / Self Care | Payer: BLUE CROSS/BLUE SHIELD | Source: Ambulatory Visit | Attending: Urology | Admitting: Urology

## 2018-12-09 ENCOUNTER — Other Ambulatory Visit: Payer: Self-pay

## 2018-12-09 ENCOUNTER — Encounter: Payer: Self-pay | Admitting: Medical Oncology

## 2018-12-09 ENCOUNTER — Ambulatory Visit
Admission: RE | Admit: 2018-12-09 | Discharge: 2018-12-09 | Disposition: A | Discharge status: Home / Self Care | Payer: BLUE CROSS/BLUE SHIELD | Source: Ambulatory Visit | Attending: Radiation Oncology | Admitting: Radiation Oncology

## 2018-12-09 VITALS — BP 116/87 | HR 64 | Temp 97.6°F | Resp 20

## 2018-12-09 DIAGNOSIS — R339 Retention of urine, unspecified: Secondary | ICD-10-CM | POA: Insufficient documentation

## 2018-12-09 DIAGNOSIS — Z7901 Long term (current) use of anticoagulants: Secondary | ICD-10-CM | POA: Insufficient documentation

## 2018-12-09 DIAGNOSIS — Z79899 Other long term (current) drug therapy: Secondary | ICD-10-CM | POA: Insufficient documentation

## 2018-12-09 DIAGNOSIS — Z7982 Long term (current) use of aspirin: Secondary | ICD-10-CM | POA: Diagnosis not present

## 2018-12-09 DIAGNOSIS — C61 Malignant neoplasm of prostate: Secondary | ICD-10-CM

## 2018-12-09 NOTE — Progress Notes (Addendum)
Radiation Oncology         [phone] ________________________________  Name: Jason Sanders MRN: [id number]  Date: 12/09/2018  DOB: 1951-01-26  Post-Seed Follow-Up Visit Note  CC: Abigail Miyamoto, FNP  Lucas Mallow, MD  Diagnosis:   68 y.o. gentleman with Stage T1c adenocarcinoma of the prostate with Gleason score of 3+4, and PSA of 7.    ICD-10-CM   1. Malignant neoplasm of prostate (H[facility]) C61     Interval Since Last Radiation:  2 weeks 11/22/18:  Insertion of radioactive I-125 seeds into the prostate gland; 145 Gy, definitive therapy.  Narrative:  The patient returns today for routine follow-up.  Unfortunately, the patient's postoperative course has been complicated by an episode of acute urinary retention which occurred on November 26, 2018.  The patient presented to the emergency department and had an indwelling Foley catheter placed which he has continued to tolerate well.  He has a scheduled visit at [facility] urology on 12/13/2018 for a voiding trial.  He has continued taking Flomax daily.  He denies gross hematuria, fever, chills or night sweats.  He is currently without complaints.  He reports a healthy appetite and is maintaining his weight.  He denies any suprapubic discomfort, abdominal pain, nausea, vomiting, diarrhea or constipation.  He filled out a questionnaire regarding urinary function today providing and overall IPSS score of 22 characterizing his symptoms as severe which is a reflection of his symptoms prior to Foley catheter placement.  His pre-implant score was 5. He denies any bowel symptoms.  ALLERGIES:  is allergic to penicillins.  Meds: Current Outpatient Medications  Medication Sig Dispense Refill  . ASPIRIN 81 PO TAKE 1 TABLET BY MOUTH EVERY DAY  11  . carvedilol (COREG) 25 MG tablet 2 (two) times daily.     Marland Kitchen diltiazem (CARDIZEM) 120 MG tablet 2 (two) times daily.     Marland Kitchen ELIQUIS 5 MG TABS tablet Take 5 mg by mouth 2 (two) times daily.  1  . ENTRESTO  97-103 MG Take 1 tablet by mouth 2 (two) times daily.  12  . furosemide (LASIX) 20 MG tablet Take 20 mg by mouth daily.  1  . HYDROcodone-acetaminophen (NORCO) 5-325 MG tablet Take 1 tablet by mouth every 4 (four) hours as needed for moderate pain. 10 tablet 0  . rosuvastatin (CRESTOR) 40 MG tablet every evening.   1  . tamsulosin (FLOMAX) 0.4 MG CAPS capsule TAKE 2 CAPSULES BY MOUTH DAILY AFTER BREAKFAST  3   No current facility-administered medications for this visit.     Physical Findings: In general this is a well appearing African-American male in no acute distress.  He's alert and oriented x4 and appropriate throughout the examination. Cardiopulmonary assessment is negative for acute distress and he exhibits normal effort.   Lab Findings: Lab Results  Component Value Date   WBC 3.5 (L) 11/15/2018   HGB 13.5 11/15/2018   HCT 41.9 11/15/2018   MCV 88.6 11/15/2018   PLT 183 11/15/2018    Radiographic Findings:  Patient underwent CT imaging in our clinic for post implant dosimetry. The CT will be reviewed by Dr. Tammi Klippel to confirm an adequate distribution of radioactive seeds throughout the prostate gland and ensure that there are no seeds in or near the rectum. We suspect the final radiation plan and dosimetry will show appropriate coverage of the prostate gland and we will notify the patient if there are any findings to suggest otherwise.   Impression/Plan: 68 y.o. gentleman with  Stage T1c adenocarcinoma of the prostate with Gleason score of 3+4, and PSA of 7. The patient is recovering from the effects of radiation. His urinary symptoms should gradually improve over the next 4-6 months. We talked about this today. He is encouraged by his improvement already and is otherwise pleased with his outcome. We also talked about long-term follow-up for prostate cancer following seed implant. He understands that ongoing PSA determinations and digital rectal exams will help perform surveillance to  rule out disease recurrence. He has a follow up appointment scheduled with Dr. Azucena Fallen, NP on 12/13/18 for voiding trial and anticipates a follow up visit with Dr. Gloriann Loan in late March or early April 2020. He understands what to expect with his PSA measures. Patient was also educated today about some of the long-term effects from radiation including a small risk for rectal bleeding and possibly erectile dysfunction. We talked about some of the general management approaches to these potential complications. However, I did encourage the patient to contact our office or return at any point if he has questions or concerns related to his previous radiation and prostate cancer.    Nicholos Johns, PA-C

## 2018-12-09 NOTE — Progress Notes (Signed)
Mr Capozzi is here for a follow-up appointment today. The patient does not require a MRI. Patient has an appointment on 12/13/18 with his urologist to have his cather remove.Patient denies any dysuria or hematuira . Patient denies any issues with his bowels. Patients IPSS  Score was 22. Vitals:   12/09/18 1008  BP: 116/87  Pulse: 64  Resp: 20  Temp: 97.6 F (36.4 C)  TempSrc: Oral  SpO2: 97%   .

## 2018-12-09 NOTE — Progress Notes (Signed)
Jason Sanders states he brachytherapy went well but 1/10 has urinary retention and had catheter placed. He has tolerated it well and hope to get it discontinued 1/27 at follow up with Dr. Gloriann Loan.

## 2018-12-09 NOTE — Addendum Note (Signed)
Encounter addended by: Freeman Caldron, PA-C on: 12/09/2018 11:38 AM  Actions taken: Clinical Note Signed

## 2018-12-11 NOTE — Progress Notes (Signed)
  Radiation Oncology         [phone]) [phone] ________________________________  Name: Jason Sanders MRN: [id number]  Date: 12/09/2018  DOB: [birth year]  COMPLEX SIMULATION NOTE  NARRATIVE:  The patient was brought to the [facility] today following prostate seed implantation approximately one month ago.  Identity was confirmed.  All relevant records and images related to the planned course of therapy were reviewed.  Then, the patient was set-up supine.  CT images were obtained.  The CT images were loaded into the planning software.  Then the prostate and rectum were contoured.  Treatment planning then occurred.  The implanted iodine 125 seeds were identified by the physics staff for projection of radiation distribution  I have requested : 3D Simulation  I have requested a DVH of the following structures: Prostate and rectum.    ________________________________  Sheral Apley Tammi Klippel, M.D.

## 2018-12-29 ENCOUNTER — Ambulatory Visit: Payer: BLUE CROSS/BLUE SHIELD | Attending: Radiation Oncology | Admitting: Radiation Oncology

## 2018-12-29 ENCOUNTER — Encounter: Payer: Self-pay | Admitting: Radiation Oncology

## 2018-12-29 DIAGNOSIS — C61 Malignant neoplasm of prostate: Secondary | ICD-10-CM | POA: Insufficient documentation

## 2019-01-11 NOTE — Progress Notes (Signed)
  Radiation Oncology         [phone]) [phone] ________________________________  Name: Jason Sanders MRN: [id number]  Date: 12/29/2018  DOB: 07-12-1951  3D Planning Note   Prostate Brachytherapy Post-Implant Dosimetry  Diagnosis: 68 y.o. gentleman with Stage T1c adenocarcinoma of the prostate with Gleason score of 3+4, and PSA of 7.   Narrative: On a previous date, Jason Sanders returned following prostate seed implantation for post implant planning. He underwent CT scan complex simulation to delineate the three-dimensional structures of the pelvis and demonstrate the radiation distribution.  Since that time, the seed localization, and complex isodose planning with dose volume histograms have now been completed.  Results:   Prostate Coverage - The dose of radiation delivered to the 90% or more of the prostate gland (D90) was 116.34% of the prescription dose. This exceeds our goal of greater than 90%. Rectal Sparing - The volume of rectal tissue receiving the prescription dose or higher was 0.04 cc. This falls under our thresholds tolerance of 1.0 cc.  Impression: The prostate seed implant appears to show adequate target coverage and appropriate rectal sparing.  Plan:  The patient will continue to follow with urology for ongoing PSA determinations. I would anticipate a high likelihood for local tumor control with minimal risk for rectal morbidity.  ________________________________  Sheral Apley Tammi Klippel, M.D.

## 2019-05-30 IMAGING — DX DG CHEST 2V
2 series · 2 of 2 positions shown · non-contrast
Comparison: None.

CLINICAL DATA: Prostate carcinoma. Preop for brachytherapy seed
placement.

EXAM:
CHEST - 2 VIEW

[chest pa]
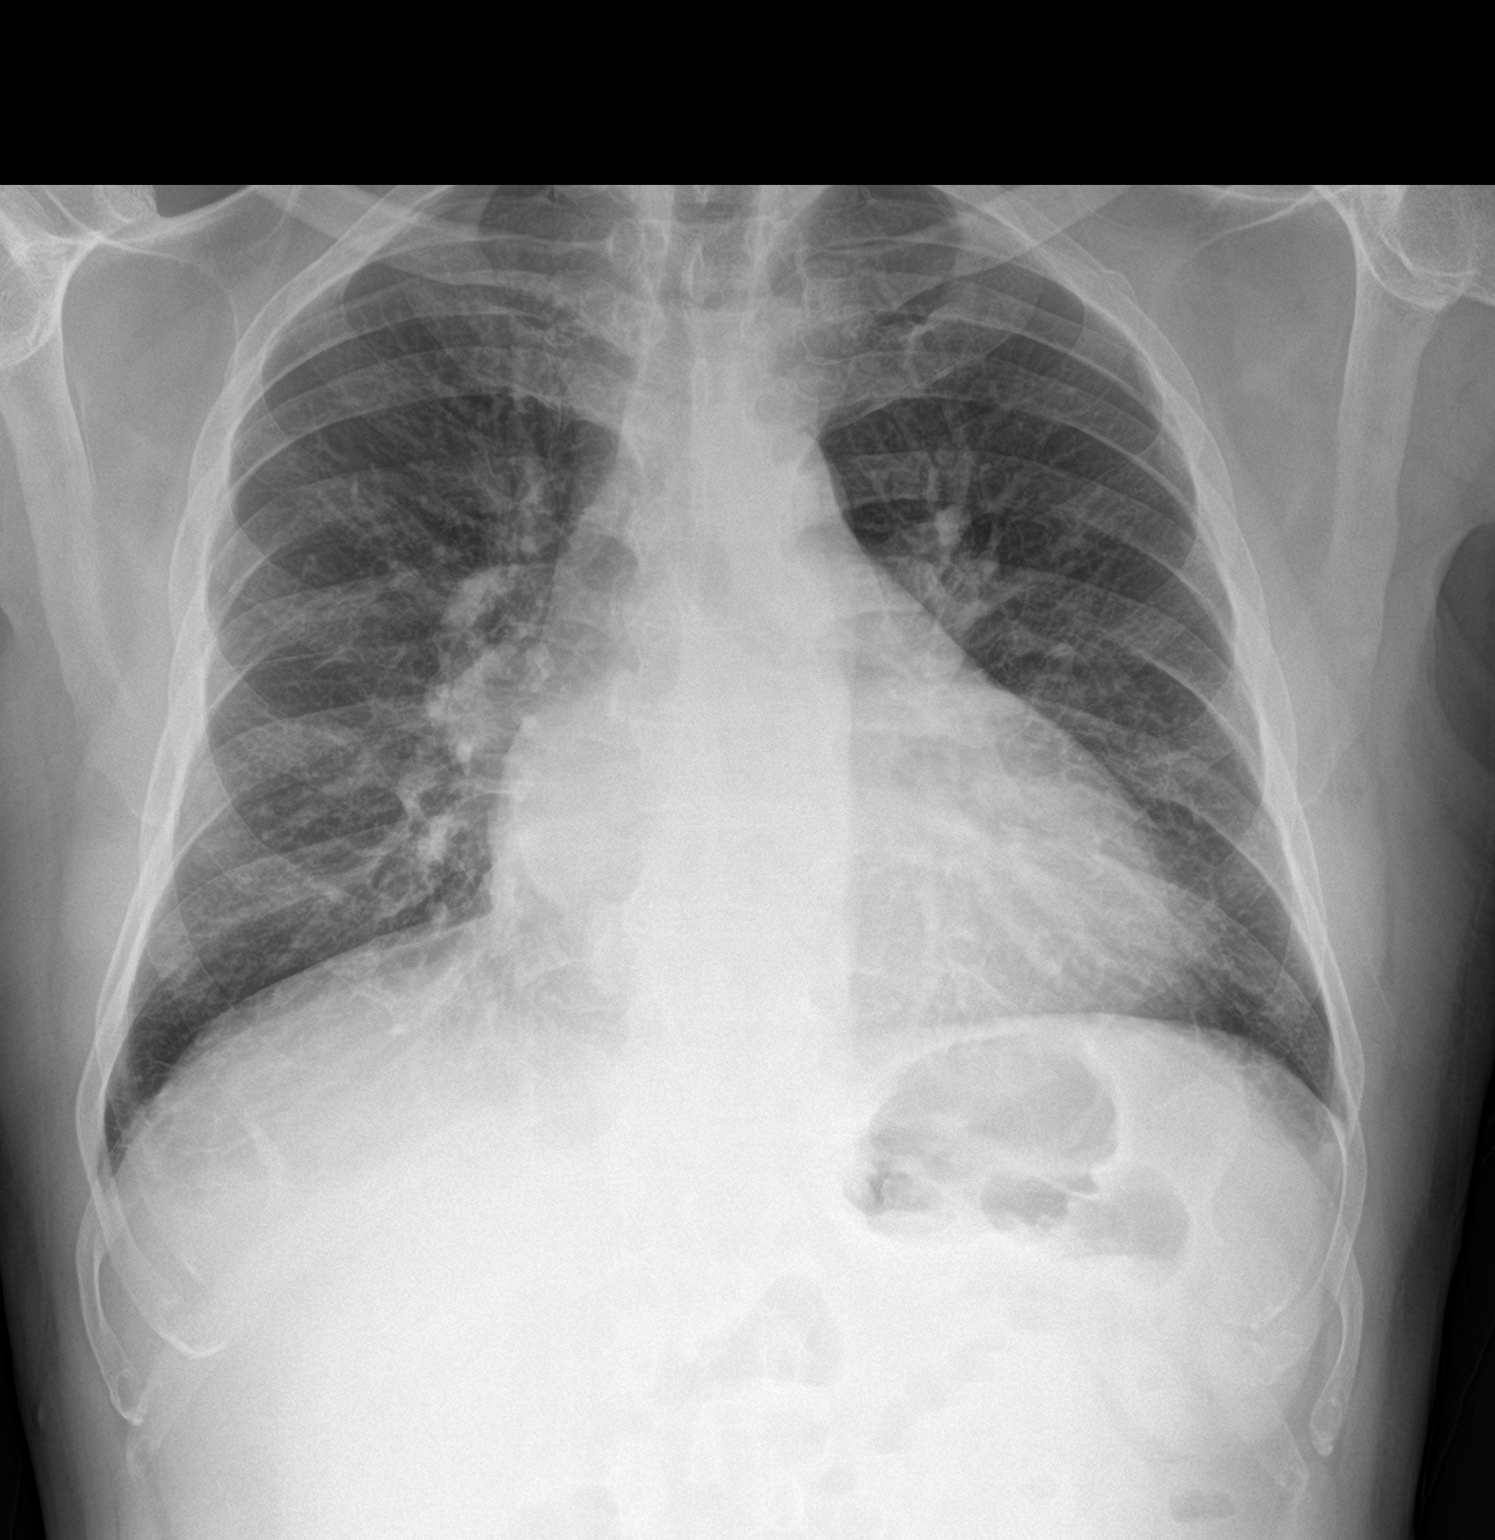

[chest lat]
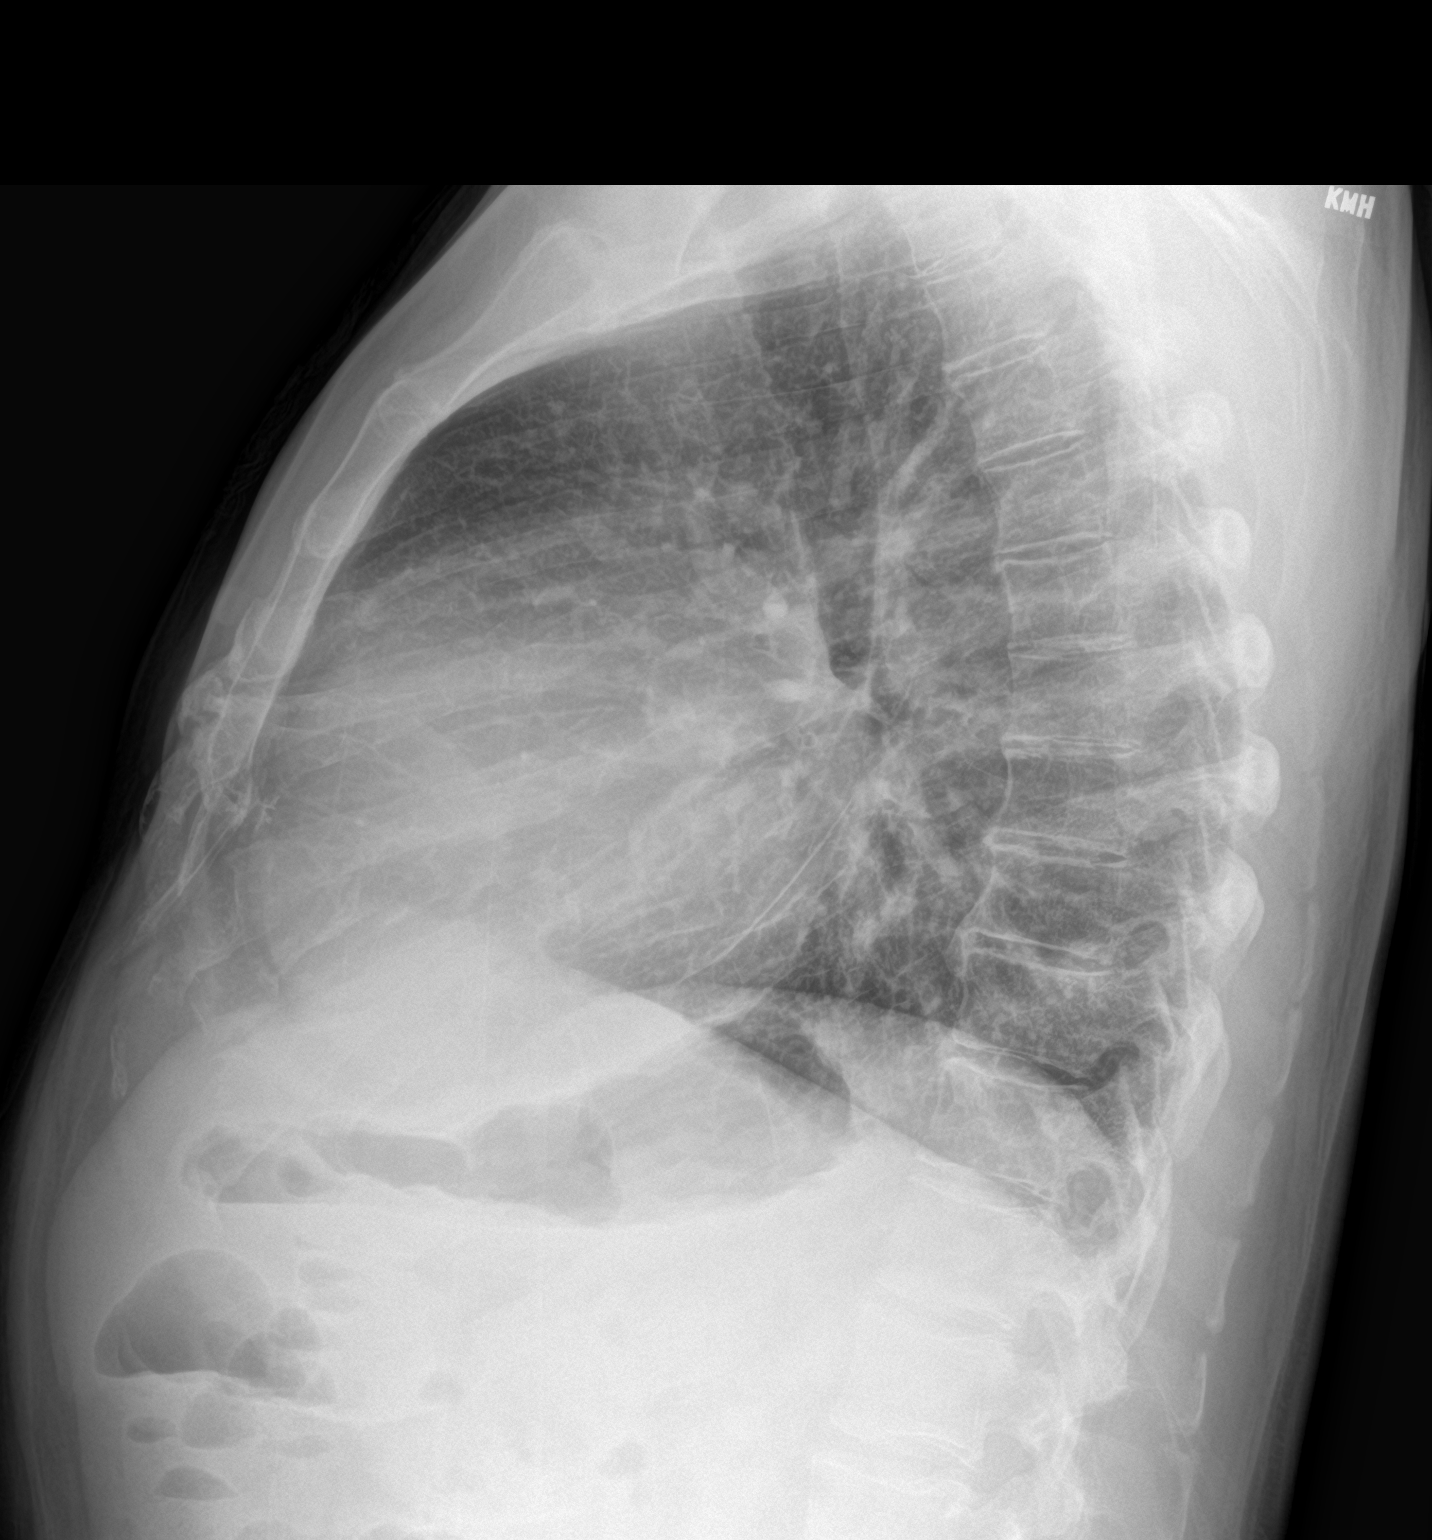

[2 of 2 positions shown; findings below may reference images not displayed]

FINDINGS: Mild-to-moderate cardiomegaly. Both lungs are clear. No evidence of
pleural effusion. No sclerotic bone lesions identified.
IMPRESSION: Cardiomegaly.  No active lung disease.
